# Patient Record
Sex: Female | Born: 1970 | Marital: Married | Smoking: Never smoker
Health system: Southern US, Community
[De-identification: ages and names within clinical notes are randomized; demographics above are authoritative.]

---

## 2002-04-27 ENCOUNTER — Other Ambulatory Visit: Admission: RE | Admit: 2002-04-27 | Discharge: 2002-04-27 | Payer: Self-pay | Admitting: *Deleted

## 2003-08-11 ENCOUNTER — Other Ambulatory Visit: Admission: RE | Admit: 2003-08-11 | Discharge: 2003-08-11 | Payer: Self-pay | Admitting: Obstetrics and Gynecology

## 2003-08-24 ENCOUNTER — Ambulatory Visit (HOSPITAL_COMMUNITY): Admission: RE | Admit: 2003-08-24 | Discharge: 2003-08-24 | Payer: Self-pay | Admitting: Obstetrics and Gynecology

## 2003-08-24 ENCOUNTER — Encounter (INDEPENDENT_AMBULATORY_CARE_PROVIDER_SITE_OTHER): Payer: Self-pay | Admitting: Specialist

## 2003-08-24 ENCOUNTER — Ambulatory Visit (HOSPITAL_BASED_OUTPATIENT_CLINIC_OR_DEPARTMENT_OTHER): Admission: RE | Admit: 2003-08-24 | Discharge: 2003-08-24 | Payer: Self-pay | Admitting: Obstetrics and Gynecology

## 2004-05-18 ENCOUNTER — Ambulatory Visit (HOSPITAL_COMMUNITY): Admission: RE | Admit: 2004-05-18 | Discharge: 2004-05-18 | Payer: Self-pay | Admitting: Oncology

## 2004-06-12 ENCOUNTER — Ambulatory Visit: Payer: Self-pay | Admitting: Oncology

## 2004-06-21 ENCOUNTER — Inpatient Hospital Stay (HOSPITAL_COMMUNITY): Admission: AD | Admit: 2004-06-21 | Discharge: 2004-06-23 | Payer: Self-pay | Admitting: Obstetrics and Gynecology

## 2004-06-21 ENCOUNTER — Ambulatory Visit: Payer: Self-pay | Admitting: Oncology

## 2004-07-31 ENCOUNTER — Ambulatory Visit: Payer: Self-pay | Admitting: Oncology

## 2005-03-27 ENCOUNTER — Ambulatory Visit: Payer: Self-pay | Admitting: Oncology

## 2005-05-23 ENCOUNTER — Ambulatory Visit: Payer: Self-pay | Admitting: Oncology

## 2005-07-23 ENCOUNTER — Ambulatory Visit: Payer: Self-pay | Admitting: Oncology

## 2005-10-22 ENCOUNTER — Ambulatory Visit: Payer: Self-pay | Admitting: Oncology

## 2005-10-22 ENCOUNTER — Inpatient Hospital Stay (HOSPITAL_COMMUNITY): Admission: AD | Admit: 2005-10-22 | Discharge: 2005-10-24 | Payer: Self-pay | Admitting: Obstetrics and Gynecology

## 2005-10-24 ENCOUNTER — Ambulatory Visit: Payer: Self-pay | Admitting: Oncology

## 2007-07-02 ENCOUNTER — Ambulatory Visit: Payer: Self-pay | Admitting: Oncology

## 2007-07-07 LAB — CBC WITH DIFFERENTIAL/PLATELET
BASO%: 0.8 % (ref 0.0–2.0)
Basophils Absolute: 0.1 10*3/uL (ref 0.0–0.1)
EOS%: 0.1 % (ref 0.0–7.0)
Eosinophils Absolute: 0 10*3/uL (ref 0.0–0.5)
HCT: 32.6 % — ABNORMAL LOW (ref 34.8–46.6)
HGB: 11.6 g/dL (ref 11.6–15.9)
LYMPH%: 15.2 % (ref 14.0–48.0)
MCH: 33.4 pg (ref 26.0–34.0)
MCHC: 35.5 g/dL (ref 32.0–36.0)
MCV: 93.9 fL (ref 81.0–101.0)
MONO#: 0.6 10*3/uL (ref 0.1–0.9)
MONO%: 6.8 % (ref 0.0–13.0)
NEUT#: 6.7 10*3/uL — ABNORMAL HIGH (ref 1.5–6.5)
NEUT%: 77.1 % — ABNORMAL HIGH (ref 39.6–76.8)
Platelets: 164 10*3/uL (ref 145–400)
RBC: 3.47 10*6/uL — ABNORMAL LOW (ref 3.70–5.32)
RDW: 13.3 % (ref 11.3–14.5)
WBC: 8.7 10*3/uL (ref 3.9–10.0)
lymph#: 1.3 10*3/uL (ref 0.9–3.3)

## 2007-07-07 LAB — HEPARIN ANTI-XA: Heparin LMW: 0.27 IU/mL

## 2007-08-18 ENCOUNTER — Inpatient Hospital Stay (HOSPITAL_COMMUNITY): Admission: RE | Admit: 2007-08-18 | Discharge: 2007-08-20 | Payer: Self-pay | Admitting: Obstetrics and Gynecology

## 2007-09-03 ENCOUNTER — Ambulatory Visit: Payer: Self-pay | Admitting: Oncology

## 2009-05-08 ENCOUNTER — Ambulatory Visit: Payer: Self-pay | Admitting: Vascular Surgery

## 2009-05-08 ENCOUNTER — Encounter (INDEPENDENT_AMBULATORY_CARE_PROVIDER_SITE_OTHER): Payer: Self-pay | Admitting: Obstetrics and Gynecology

## 2009-05-08 ENCOUNTER — Ambulatory Visit: Admission: RE | Admit: 2009-05-08 | Discharge: 2009-05-08 | Payer: Self-pay | Admitting: Obstetrics and Gynecology

## 2009-06-02 ENCOUNTER — Ambulatory Visit: Payer: Self-pay | Admitting: Oncology

## 2009-06-06 LAB — CBC WITH DIFFERENTIAL/PLATELET
BASO%: 0.3 % (ref 0.0–2.0)
Basophils Absolute: 0 10*3/uL (ref 0.0–0.1)
EOS%: 0.3 % (ref 0.0–7.0)
Eosinophils Absolute: 0 10*3/uL (ref 0.0–0.5)
HCT: 35.3 % (ref 34.8–46.6)
HGB: 12.2 g/dL (ref 11.6–15.9)
LYMPH%: 24.7 % (ref 14.0–49.7)
MCH: 32.1 pg (ref 25.1–34.0)
MCHC: 34.7 g/dL (ref 31.5–36.0)
MCV: 92.5 fL (ref 79.5–101.0)
MONO#: 0.5 10*3/uL (ref 0.1–0.9)
MONO%: 8.4 % (ref 0.0–14.0)
NEUT#: 4 10*3/uL (ref 1.5–6.5)
NEUT%: 66.3 % (ref 38.4–76.8)
Platelets: 202 10*3/uL (ref 145–400)
RBC: 3.81 10*6/uL (ref 3.70–5.45)
RDW: 12.4 % (ref 11.2–14.5)
WBC: 6 10*3/uL (ref 3.9–10.3)
lymph#: 1.5 10*3/uL (ref 0.9–3.3)

## 2009-06-06 LAB — COMPREHENSIVE METABOLIC PANEL
ALT: 10 U/L (ref 0–35)
AST: 9 U/L (ref 0–37)
Albumin: 4.3 g/dL (ref 3.5–5.2)
Alkaline Phosphatase: 29 U/L — ABNORMAL LOW (ref 39–117)
BUN: 12 mg/dL (ref 6–23)
CO2: 22 mEq/L (ref 19–32)
Calcium: 9.5 mg/dL (ref 8.4–10.5)
Chloride: 104 mEq/L (ref 96–112)
Creatinine, Ser: 0.73 mg/dL (ref 0.40–1.20)
Glucose, Bld: 86 mg/dL (ref 70–99)
Potassium: 4.2 mEq/L (ref 3.5–5.3)
Sodium: 137 mEq/L (ref 135–145)
Total Bilirubin: 0.4 mg/dL (ref 0.3–1.2)
Total Protein: 7.1 g/dL (ref 6.0–8.3)

## 2009-06-06 LAB — HEPARIN ANTI-XA: Heparin LMW: 0.22 IU/mL

## 2009-06-06 LAB — LACTATE DEHYDROGENASE: LDH: 93 U/L — ABNORMAL LOW (ref 94–250)

## 2009-06-29 ENCOUNTER — Ambulatory Visit (HOSPITAL_COMMUNITY): Admission: RE | Admit: 2009-06-29 | Discharge: 2009-06-29 | Payer: Self-pay | Admitting: Obstetrics and Gynecology

## 2009-12-29 ENCOUNTER — Ambulatory Visit: Payer: Self-pay | Admitting: Physician Assistant

## 2009-12-29 ENCOUNTER — Inpatient Hospital Stay (HOSPITAL_COMMUNITY): Admission: AD | Admit: 2009-12-29 | Discharge: 2009-12-30 | Payer: Self-pay | Admitting: Obstetrics and Gynecology

## 2010-01-25 ENCOUNTER — Ambulatory Visit: Payer: Self-pay | Admitting: Oncology

## 2010-01-29 LAB — CBC WITH DIFFERENTIAL/PLATELET
BASO%: 0.2 % (ref 0.0–2.0)
Basophils Absolute: 0 10*3/uL (ref 0.0–0.1)
EOS%: 0.4 % (ref 0.0–7.0)
Eosinophils Absolute: 0 10*3/uL (ref 0.0–0.5)
HCT: 29.1 % — ABNORMAL LOW (ref 34.8–46.6)
HGB: 10.3 g/dL — ABNORMAL LOW (ref 11.6–15.9)
LYMPH%: 20.7 % (ref 14.0–49.7)
MCH: 32.9 pg (ref 25.1–34.0)
MCHC: 35.3 g/dL (ref 31.5–36.0)
MCV: 93.2 fL (ref 79.5–101.0)
MONO#: 0.5 10*3/uL (ref 0.1–0.9)
MONO%: 7.2 % (ref 0.0–14.0)
NEUT#: 4.9 10*3/uL (ref 1.5–6.5)
NEUT%: 71.5 % (ref 38.4–76.8)
Platelets: 184 10*3/uL (ref 145–400)
RBC: 3.12 10*6/uL — ABNORMAL LOW (ref 3.70–5.45)
RDW: 14.3 % (ref 11.2–14.5)
WBC: 6.8 10*3/uL (ref 3.9–10.3)
lymph#: 1.4 10*3/uL (ref 0.9–3.3)

## 2010-01-29 LAB — D-DIMER, QUANTITATIVE: D-Dimer, Quant: 0.74 ug/mL-FEU — ABNORMAL HIGH (ref 0.00–0.48)

## 2010-01-29 LAB — FIBRINOGEN: Fibrinogen: 362 mg/dL (ref 204–475)

## 2010-05-25 ENCOUNTER — Ambulatory Visit: Payer: Self-pay | Admitting: Oncology

## 2010-05-29 LAB — COMPREHENSIVE METABOLIC PANEL
ALT: <8 U/L (ref 0–35)
AST: 12 U/L (ref 0–37)
Albumin: 3.2 g/dL — ABNORMAL LOW (ref 3.5–5.2)
Alkaline Phosphatase: 158 U/L — ABNORMAL HIGH (ref 39–117)
BUN: 9 mg/dL (ref 6–23)
CO2: 22 mEq/L (ref 19–32)
Calcium: 8.8 mg/dL (ref 8.4–10.5)
Chloride: 105 mEq/L (ref 96–112)
Creatinine, Ser: 0.55 mg/dL (ref 0.40–1.20)
Glucose, Bld: 78 mg/dL (ref 70–99)
Potassium: 3.8 mEq/L (ref 3.5–5.3)
Sodium: 139 mEq/L (ref 135–145)
Total Bilirubin: 0.3 mg/dL (ref 0.3–1.2)
Total Protein: 5.9 g/dL — ABNORMAL LOW (ref 6.0–8.3)

## 2010-05-29 LAB — CBC WITH DIFFERENTIAL/PLATELET
BASO%: 0.2 % (ref 0.0–2.0)
Basophils Absolute: 0 10*3/uL (ref 0.0–0.1)
EOS%: 0.2 % (ref 0.0–7.0)
Eosinophils Absolute: 0 10*3/uL (ref 0.0–0.5)
HCT: 32.4 % — ABNORMAL LOW (ref 34.8–46.6)
HGB: 11.4 g/dL — ABNORMAL LOW (ref 11.6–15.9)
LYMPH%: 18.7 % (ref 14.0–49.7)
MCH: 33.6 pg (ref 25.1–34.0)
MCHC: 35.1 g/dL (ref 31.5–36.0)
MCV: 95.7 fL (ref 79.5–101.0)
MONO#: 0.6 10*3/uL (ref 0.1–0.9)
MONO%: 7.8 % (ref 0.0–14.0)
NEUT#: 5.3 10*3/uL (ref 1.5–6.5)
NEUT%: 73.1 % (ref 38.4–76.8)
Platelets: 124 10*3/uL — ABNORMAL LOW (ref 145–400)
RBC: 3.39 10*6/uL — ABNORMAL LOW (ref 3.70–5.45)
RDW: 13.1 % (ref 11.2–14.5)
WBC: 7.2 10*3/uL (ref 3.9–10.3)
lymph#: 1.4 10*3/uL (ref 0.9–3.3)

## 2010-05-29 LAB — LACTATE DEHYDROGENASE: LDH: 106 U/L (ref 94–250)

## 2010-06-06 ENCOUNTER — Inpatient Hospital Stay (HOSPITAL_COMMUNITY): Admission: AD | Admit: 2010-06-06 | Discharge: 2010-06-08 | Payer: Self-pay | Admitting: Obstetrics and Gynecology

## 2010-06-19 LAB — CBC WITH DIFFERENTIAL/PLATELET
BASO%: 0.2 % (ref 0.0–2.0)
Basophils Absolute: 0 10*3/uL (ref 0.0–0.1)
EOS%: 0.5 % (ref 0.0–7.0)
Eosinophils Absolute: 0 10*3/uL (ref 0.0–0.5)
HCT: 41.9 % (ref 34.8–46.6)
HGB: 13.8 g/dL (ref 11.6–15.9)
LYMPH%: 26.6 % (ref 14.0–49.7)
MCH: 32.2 pg (ref 25.1–34.0)
MCHC: 33 g/dL (ref 31.5–36.0)
MCV: 97.4 fL (ref 79.5–101.0)
MONO#: 0.7 10*3/uL (ref 0.1–0.9)
MONO%: 8.5 % (ref 0.0–14.0)
NEUT#: 5.1 10*3/uL (ref 1.5–6.5)
NEUT%: 64.2 % (ref 38.4–76.8)
Platelets: 257 10*3/uL (ref 145–400)
RBC: 4.3 10*6/uL (ref 3.70–5.45)
RDW: 12.9 % (ref 11.2–14.5)
WBC: 8 10*3/uL (ref 3.9–10.3)
lymph#: 2.1 10*3/uL (ref 0.9–3.3)

## 2010-06-19 LAB — CHCC SMEAR

## 2010-06-19 LAB — MORPHOLOGY
PLT EST: ADEQUATE
RBC Comments: NORMAL

## 2010-10-24 LAB — RH IMMUNE GLOB WKUP(>/=20WKS)(NOT WOMEN'S HOSP)
Fetal Screen: NEGATIVE
Unit division: 0

## 2010-10-24 LAB — CBC
HCT: 31.3 % — ABNORMAL LOW (ref 36.0–46.0)
HCT: 34.3 % — ABNORMAL LOW (ref 36.0–46.0)
Hemoglobin: 11 g/dL — ABNORMAL LOW (ref 12.0–15.0)
Hemoglobin: 11.8 g/dL — ABNORMAL LOW (ref 12.0–15.0)
MCH: 33.5 pg (ref 26.0–34.0)
MCH: 34.3 pg — ABNORMAL HIGH (ref 26.0–34.0)
MCHC: 34.4 g/dL (ref 30.0–36.0)
MCHC: 35.2 g/dL (ref 30.0–36.0)
MCV: 97.3 fL (ref 78.0–100.0)
MCV: 97.6 fL (ref 78.0–100.0)
Platelets: 111 10*3/uL — ABNORMAL LOW (ref 150–400)
Platelets: 136 10*3/uL — ABNORMAL LOW (ref 150–400)
RBC: 3.21 MIL/uL — ABNORMAL LOW (ref 3.87–5.11)
RBC: 3.52 MIL/uL — ABNORMAL LOW (ref 3.87–5.11)
RDW: 13 % (ref 11.5–15.5)
RDW: 13.1 % (ref 11.5–15.5)
WBC: 11.6 10*3/uL — ABNORMAL HIGH (ref 4.0–10.5)
WBC: 8 10*3/uL (ref 4.0–10.5)

## 2010-10-24 LAB — RPR: RPR Ser Ql: NONREACTIVE

## 2010-10-29 LAB — RAPID STREP SCREEN (MED CTR MEBANE ONLY): Streptococcus, Group A Screen (Direct): POSITIVE — AB

## 2010-11-14 LAB — CBC
HCT: 37.1 % (ref 36.0–46.0)
Hemoglobin: 12.4 g/dL (ref 12.0–15.0)
MCHC: 33.5 g/dL (ref 30.0–36.0)
MCV: 93.7 fL (ref 78.0–100.0)
Platelets: 253 10*3/uL (ref 150–400)
RBC: 3.97 MIL/uL (ref 3.87–5.11)
RDW: 12.2 % (ref 11.5–15.5)
WBC: 6.8 10*3/uL (ref 4.0–10.5)

## 2010-11-14 LAB — PROTIME-INR
INR: 0.91 (ref 0.00–1.49)
Prothrombin Time: 12.2 seconds (ref 11.6–15.2)

## 2010-11-14 LAB — RH IMMUNE GLOBULIN WORKUP (NOT WOMEN'S HOSP)
ABO/RH(D): O NEG
Antibody Screen: NEGATIVE

## 2010-11-14 LAB — APTT: aPTT: 25 seconds (ref 24–37)

## 2010-12-25 NOTE — H&P (Signed)
NAME:  Rosero, Alta                ACCOUNT NO.:  [id number]   MEDICAL RECORD NO.:  [id number]          PATIENT TYPE:  INP   LOCATION:  9162                          FACILITY:  [facility]   PHYSICIAN:  Lenoard Aden, M.D.DATE OF BIRTH:  1971/06/18   DATE OF ADMISSION:  08/18/2007  DATE OF DISCHARGE:                              HISTORY & PHYSICAL   CHIEF COMPLAINT:  Coagulopathy on Lovenox.   HISTORY OF PRESENT ILLNESS:  She is a 40 year old white female, G3, P1  at 38-weeks gestation for induction and timed delivery due to use of  anticoagulation and need to reverse anticoagulation with timed  induction.   MEDICATIONS:  Lovenox and prenatal vitamins.   ALLERGIES:  No known drug allergies.   SOCIAL HISTORY:  She is a nonsmoker, nondrinker, denies domestic or  physical violence.   OBSTETRICAL HISTORY:  Remarkable for SAB with __________ in 2005,  history of spontaneous vaginal delivery in 2005 complicated by [facility],  history of uncomplicated vaginal delivery 2007, history of DVT, history  of prothrombin gene mutation.  Prenatal course otherwise uncomplicated.  History of Rh negativity and received RhoGAM on physical examination.   PHYSICAL EXAMINATION:  VITAL SIGNS:  Stable, afebrile.  HEENT:  Normal.  LUNGS:  Clear.  HEART:  Regular rhythm.  ABDOMEN:  Soft, gravid, nontender.  Estimated fetal weight  7 pounds,  cervix is 4 cm, 80% vertex, -1.  Artificial rupture of membranes clear.  EXTREMITIES:  No cords.  NEUROLOGICAL:  Nonfocal.  SKIN:  Intact.   IMPRESSION:  1. A 38-week OB.  2. Thrombophilia with prothrombin gene mutation on Lovenox for timed      induction delivery.   PLAN:  Proceed with epidural.  Hematology follow up as noted.  Resume  anticoagulation postpartum.  Risks and benefits discussed.     Lenoard Aden, M.D.  Electronically Signed    RJT/MEDQ  D:  08/18/2007  T:  08/18/2007  Job:  [id number]

## 2010-12-28 NOTE — Consult Note (Signed)
NAME:  Powers, Margaret                ACCOUNT NO.:  [id number]   MEDICAL RECORD NO.:  [id number]           PATIENT TYPE:   LOCATION:                                 FACILITY:   PHYSICIAN:  Genene Churn. Cyndie Chime, M.D.  DATE OF BIRTH:   DATE OF CONSULTATION:  06/11/2004  DATE OF DISCHARGE:                                   CONSULTATION   HISTORY:  This is a hematology consultation to evaluate this lady for  perioperative anticoagulation in view of underlying congenital coagulopathy.   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  She was on  birth control pills with a transdermal estrogen patch at the time.  She had  no prior, personal, or family history of any clotting problems.  Special  hematology studies were done and she was found to be a heterozygote for the  prothrombin gene mutation.  Factor V Leiden mutation was not detected.  Anticardiolipin antibodies/antiphospholipid antibodies were not detected.  She was anticoagulated with Coumadin initially.   She became pregnant in late November 2004.  The plan was to begin her on  prophylactic Lovenox starting in the third trimester.  Unfortunately, she  was only on the Lovenox for a few weeks when she had a miscarriage in  January 2005.  She got pregnant again in February and notified us at about 5  weeks.  She was started promptly on low-dose Lovenox 40 mg subcutaneous  daily.  She has been on this dose throughout the entire pregnancy until very  recently.  On May 18, 2004 she was evaluated for two isolated episodes 24  hours apart of acute onset of dyspnea and palpitations.  CT scan of the  chest with pulmonary embolus protocol was unremarkable for a pulmonary  embolus.  However, I elected to increase her to a therapeutic dose of the  Lovenox (90 mg).  She is now admitted at [redacted] weeks gestation for a planned  induction of labor.  Current  Lovenox dose is 90 mg daily.  CBC done in our  office on June 05, 2004 showed a hemoglobin of 12.8 and platelet count of  190,000.  Factor Xa level on Lovenox was 0.69, with therapeutic range 0.6-  1.0.   IMPRESSION:  A woman with prothrombin gene heterozygote status, now at term  of her second pregnancy.   RECOMMENDATIONS:  The patient has been instructed to stop her Lovenox 24  hours prior to planned induction.  We will check a STAT factor Xa (heparin)  level.  If the level is, in fact, undetectable then proceed with spinal  anesthesia and induction of labor as planned.  Since there is no good agent  to reverse Lovenox, if levels are still in a therapeutic range we would have  to wait on the spinal anesthetic to ensure that we do not have any epidural  bleeding.   I will resume Lovenox 8 hours after removal of epidural catheter at 1 mg/kg  q.12h., and then begin her on Coumadin 10  mg daily which was her therapeutic  dose in the past.   We will continue to monitor protimes and heparin levels in my office after  hospital discharge.   Thank you for this consultation.                                                       Genene Churn. Cyndie Chime, M.D.  Electronically Signed    JMG/MEDQ  D:  06/11/2004  T:  06/11/2004  Job:  [id number]   cc:   Lenoard Aden, M.D.  [address]  [place]  [place] [address]  Fax: [phone]

## 2010-12-28 NOTE — Op Note (Signed)
NAME:  Margaret Powers, Margaret Powers                            ACCOUNT NO.:  [id number]   MEDICAL RECORD NO.:  [id number]                   PATIENT TYPE:  AMB   LOCATION:  [facility]                                 FACILITY:  [facility]   PHYSICIAN:  Lenoard Aden, M.D.             DATE OF BIRTH:  1971-08-06   DATE OF PROCEDURE:  08/24/2003  DATE OF DISCHARGE:                                 OPERATIVE REPORT   PREOPERATIVE DIAGNOSIS:  Missed abortion at nine weeks.   POSTOPERATIVE DIAGNOSIS:  Missed abortion at nine weeks.   PROCEDURE:  Suction D&E.   SURGEON:  Lenoard Aden, M.D.   ANESTHESIA:  MAC paracervical.   ESTIMATED BLOOD LOSS:  Less than 50 cc.   COMPLICATIONS:  None.   DRAINS:  None.   COUNTS:  Correct.   DISPOSITION:  The patient to recovery room in good condition.  Products of  conception to pathology.   BRIEF OPERATIVE NOTE:  After being apprised of the risks of anesthesia,  infection, bleeding, uterine perforation with need for repair, the patient  is brought to the operating room.  She was administered IV sedation without  difficulty, prepped and draped in the usual sterile fashion.  A catheter to  the bladder and was emptied after achieving adequate anesthesia.  Dilute  Xylocaine solution placed in the standard paracervical block (22 cc).   Examination under anesthesia reveals a small anteflexed, 6-8 week size  uterus.  This was easily dilated up to a #21 Pratt dilator.  A  7 mm suction curet placed.  Products of conception noted and obtained during  suction.  Repeat suction and blunt curettage in a four-quadrant method  reveals the cavity to be empty.  Good hemostasis was noted.  All instruments  are removed.  The patient tolerated the procedure well and was taken to  recovery room in good condition.                                               Lenoard Aden, M.D.    RJT/MEDQ  D:  08/24/2003  T:  08/24/2003  Job:  [id number]

## 2011-05-01 LAB — CBC
HCT: 32.4 — ABNORMAL LOW
HCT: 33 — ABNORMAL LOW
HCT: 33.4 — ABNORMAL LOW
Hemoglobin: 11.6 — ABNORMAL LOW
Hemoglobin: 11.6 — ABNORMAL LOW
Hemoglobin: 11.6 — ABNORMAL LOW
MCHC: 34.8
MCHC: 35
MCHC: 35.8
MCV: 95
MCV: 95
Platelets: 144 — ABNORMAL LOW
Platelets: 146 — ABNORMAL LOW
RBC: 3.41 — ABNORMAL LOW
RBC: 3.43 — ABNORMAL LOW
RBC: 3.51 — ABNORMAL LOW
RDW: 12.8
RDW: 13.5
RDW: 13.7
WBC: 11.3 — ABNORMAL HIGH
WBC: 7.7
WBC: 9.5

## 2011-05-01 LAB — RPR: RPR Ser Ql: NONREACTIVE

## 2012-09-22 ENCOUNTER — Other Ambulatory Visit: Payer: Self-pay | Admitting: Obstetrics and Gynecology

## 2012-09-22 DIAGNOSIS — Z1231 Encounter for screening mammogram for malignant neoplasm of breast: Secondary | ICD-10-CM

## 2012-10-28 ENCOUNTER — Ambulatory Visit
Admission: RE | Admit: 2012-10-28 | Discharge: 2012-10-28 | Disposition: A | Discharge status: Home / Self Care | Payer: BC Managed Care – PPO | Source: Ambulatory Visit | Attending: Obstetrics and Gynecology | Admitting: Obstetrics and Gynecology

## 2012-10-28 DIAGNOSIS — Z1231 Encounter for screening mammogram for malignant neoplasm of breast: Secondary | ICD-10-CM

## 2013-11-11 ENCOUNTER — Other Ambulatory Visit: Payer: Self-pay

## 2013-11-11 DIAGNOSIS — Z1231 Encounter for screening mammogram for malignant neoplasm of breast: Secondary | ICD-10-CM

## 2013-12-01 ENCOUNTER — Ambulatory Visit: Payer: BC Managed Care – PPO

## 2013-12-13 ENCOUNTER — Ambulatory Visit
Admission: RE | Admit: 2013-12-13 | Discharge: 2013-12-13 | Disposition: A | Discharge status: Home / Self Care | Payer: BC Managed Care – PPO | Source: Ambulatory Visit

## 2013-12-13 DIAGNOSIS — Z1231 Encounter for screening mammogram for malignant neoplasm of breast: Secondary | ICD-10-CM

## 2014-10-12 ENCOUNTER — Other Ambulatory Visit: Payer: Self-pay

## 2014-10-12 DIAGNOSIS — Z1231 Encounter for screening mammogram for malignant neoplasm of breast: Secondary | ICD-10-CM

## 2014-11-16 ENCOUNTER — Telehealth: Payer: Self-pay | Admitting: *Deleted

## 2014-11-16 NOTE — Telephone Encounter (Signed)
Message per pt - states [place] will be [place] to [place] this summer and the flight one direction takes 24hrs. She has a hx of DVT.  Wants to know if she will need to be placed on a blood thinner ;  Does she needs to schedule an appt w/you?  States she has not been having any problems. Telephone # [phone]. Thanks

## 2014-11-21 NOTE — Telephone Encounter (Signed)
I would advise her to start aspirin 325 mg the day before travel then 81 mg daily and to wear elastic stockings.

## 2014-11-22 NOTE — Telephone Encounter (Signed)
Pt called / instructed to "start ASA 325mg  the day before travel then 81 mg daily and to wear elastic stockings". Per Dr Cyndie ChimeGranfortuna. Pt voiced understanding; repeated instructions.  Pt asked if it's ok take ASA instead of Coumadin ; talked to Dr Cyndie ChimeGranfortuna who stated it's ok; pt informed.

## 2015-02-03 ENCOUNTER — Telehealth: Payer: Self-pay | Admitting: *Deleted

## 2015-02-03 ENCOUNTER — Other Ambulatory Visit: Payer: Self-pay | Admitting: Internal Medicine

## 2015-02-03 DIAGNOSIS — R5081 Fever presenting with conditions classified elsewhere: Secondary | ICD-10-CM

## 2015-02-03 LAB — COMPREHENSIVE METABOLIC PANEL
ALT: <8 U/L (ref 0–35)
AST: 13 U/L (ref 0–37)
Albumin: 3.9 g/dL (ref 3.5–5.2)
Alkaline Phosphatase: 45 U/L (ref 39–117)
BILIRUBIN TOTAL: 0.4 mg/dL (ref 0.2–1.2)
BUN: 11 mg/dL (ref 6–23)
CO2: 24 mEq/L (ref 19–32)
Calcium: 9.1 mg/dL (ref 8.4–10.5)
Chloride: 105 mEq/L (ref 96–112)
Creat: 0.84 mg/dL (ref 0.50–1.10)
Glucose, Bld: 93 mg/dL (ref 70–99)
Potassium: 3.7 mEq/L (ref 3.5–5.3)
Sodium: 141 mEq/L (ref 135–145)
Total Protein: 7.3 g/dL (ref 6.0–8.3)

## 2015-02-03 LAB — CBC WITH DIFFERENTIAL/PLATELET
Basophils Absolute: 0 10*3/uL (ref 0.0–0.1)
Basophils Relative: 0 % (ref 0–1)
Eosinophils Absolute: 0 10*3/uL (ref 0.0–0.7)
Eosinophils Relative: 0 % (ref 0–5)
HCT: 40.8 % (ref 36.0–46.0)
Hemoglobin: 13.7 g/dL (ref 12.0–15.0)
Lymphocytes Relative: 18 % (ref 12–46)
Lymphs Abs: 0.8 10*3/uL (ref 0.7–4.0)
MCH: 30.8 pg (ref 26.0–34.0)
MCHC: 33.6 g/dL (ref 30.0–36.0)
MCV: 91.7 fL (ref 78.0–100.0)
MONO ABS: 0.3 10*3/uL (ref 0.1–1.0)
MPV: 10.8 fL (ref 8.6–12.4)
Monocytes Relative: 6 % (ref 3–12)
Neutro Abs: 3.5 10*3/uL (ref 1.7–7.7)
Neutrophils Relative %: 76 % (ref 43–77)
PLATELETS: 194 10*3/uL (ref 150–400)
RBC: 4.45 MIL/uL (ref 3.87–5.11)
RDW: 13 % (ref 11.5–15.5)
WBC: 4.6 10*3/uL (ref 4.0–10.5)

## 2015-02-03 LAB — HEPATITIS A ANTIBODY, IGM: HEP A IGM: NONREACTIVE

## 2015-02-03 NOTE — Telephone Encounter (Signed)
-----   Message from Gardiner Barefoot, MD sent at 02/03/2015  7:43 AM EDT ----- She is a post traveler who is sick.  She is going to come in at 9am and get labs - blood cultures x 2, cbc with diff, cmp, malaria smear, dengue IgM, hepatitis A IgM, stool culture.  I will probably stop by and see her too (she is an employee and I know her).   Thanks

## 2015-02-03 NOTE — Telephone Encounter (Signed)
Order written and patient will take to [facility] as [facility] lab closed today. Patient and MD aware Wendall Mola

## 2015-02-05 LAB — MALARIA SMEAR
RESULT - MALAR: NEGATIVE
Result - MALAR: NONE SEEN

## 2015-02-05 LAB — DENGUE FEVER ABS, IGG, IGM
DENGUE FEVER ANTIBODIES (IGG,IGM): 0.8
Dengue Fever Ab (IgM): 0.19

## 2015-02-09 LAB — CULTURE, BLOOD (SINGLE)
CULTURE: NO GROWTH
ORGANISM ID, BACTERIA: NO GROWTH
Organism ID, Bacteria: NO GROWTH

## 2015-06-08 ENCOUNTER — Ambulatory Visit
Admission: RE | Admit: 2015-06-08 | Discharge: 2015-06-08 | Disposition: A | Discharge status: Home / Self Care | Payer: BLUE CROSS/BLUE SHIELD | Source: Ambulatory Visit

## 2015-06-08 DIAGNOSIS — Z1231 Encounter for screening mammogram for malignant neoplasm of breast: Secondary | ICD-10-CM

## 2015-06-13 ENCOUNTER — Other Ambulatory Visit: Payer: Self-pay | Admitting: Obstetrics and Gynecology

## 2015-06-13 DIAGNOSIS — R928 Other abnormal and inconclusive findings on diagnostic imaging of breast: Secondary | ICD-10-CM

## 2015-06-19 ENCOUNTER — Ambulatory Visit
Admission: RE | Admit: 2015-06-19 | Discharge: 2015-06-19 | Disposition: A | Discharge status: Home / Self Care | Payer: BLUE CROSS/BLUE SHIELD | Source: Ambulatory Visit | Attending: Obstetrics and Gynecology | Admitting: Obstetrics and Gynecology

## 2015-06-19 DIAGNOSIS — R928 Other abnormal and inconclusive findings on diagnostic imaging of breast: Secondary | ICD-10-CM

## 2015-09-12 ENCOUNTER — Ambulatory Visit (INDEPENDENT_AMBULATORY_CARE_PROVIDER_SITE_OTHER): Payer: BLUE CROSS/BLUE SHIELD | Admitting: Internal Medicine

## 2015-09-12 DIAGNOSIS — Z9189 Other specified personal risk factors, not elsewhere classified: Secondary | ICD-10-CM

## 2015-09-12 MED ORDER — CIPROFLOXACIN HCL 500 MG PO TABS: 500.0000 mg | ORAL_TABLET | Freq: Two times a day (BID) | ORAL | Status: DC

## 2015-09-12 MED ORDER — ATOVAQUONE-PROGUANIL HCL 250-100 MG PO TABS: 1.0000 | ORAL_TABLET | Freq: Every day | ORAL | Status: DC

## 2015-09-12 NOTE — Progress Notes (Signed)
  RFV: pre travel counseling [place] Subjective:    Patient ID: Margaret Powers, female    DOB: 1971-06-07, 45 y.o.   MRN: [id number]  HPI Going on medical mission to [place], and [place], [place] from feb 16 thru 28th  Went to [place] last year, complicated by having 12 days of diarrhea upon return  Works in healthcare, uptodate on vaccines   Review of Systems     Objective:   Physical Exam        Assessment & Plan:  Recommend to get hep A #2, which she is contemplating getting  Malaria prophylaxis Will give rx for malarone,   traverlers' diarrhea = gave rx for cipro  Also gave recs to get emergency evacuation insurance

## 2015-09-29 ENCOUNTER — Encounter: Payer: BLUE CROSS/BLUE SHIELD | Admitting: Internal Medicine

## 2016-06-25 ENCOUNTER — Other Ambulatory Visit: Payer: Self-pay | Admitting: Obstetrics and Gynecology

## 2016-06-25 DIAGNOSIS — Z1231 Encounter for screening mammogram for malignant neoplasm of breast: Secondary | ICD-10-CM

## 2016-07-30 ENCOUNTER — Ambulatory Visit
Admission: RE | Admit: 2016-07-30 | Discharge: 2016-07-30 | Disposition: A | Discharge status: Home / Self Care | Payer: BLUE CROSS/BLUE SHIELD | Source: Ambulatory Visit | Attending: Obstetrics and Gynecology | Admitting: Obstetrics and Gynecology

## 2016-07-30 DIAGNOSIS — Z1231 Encounter for screening mammogram for malignant neoplasm of breast: Secondary | ICD-10-CM

## 2017-08-19 ENCOUNTER — Other Ambulatory Visit: Payer: Self-pay | Admitting: Obstetrics and Gynecology

## 2017-08-19 DIAGNOSIS — Z1231 Encounter for screening mammogram for malignant neoplasm of breast: Secondary | ICD-10-CM

## 2017-09-09 ENCOUNTER — Ambulatory Visit
Admission: RE | Admit: 2017-09-09 | Discharge: 2017-09-09 | Disposition: A | Discharge status: Home / Self Care | Payer: BLUE CROSS/BLUE SHIELD | Source: Ambulatory Visit | Attending: Obstetrics and Gynecology | Admitting: Obstetrics and Gynecology

## 2017-09-09 DIAGNOSIS — Z1231 Encounter for screening mammogram for malignant neoplasm of breast: Secondary | ICD-10-CM | POA: Diagnosis not present

## 2017-09-10 ENCOUNTER — Other Ambulatory Visit: Payer: Self-pay | Admitting: Obstetrics and Gynecology

## 2017-09-10 DIAGNOSIS — R928 Other abnormal and inconclusive findings on diagnostic imaging of breast: Secondary | ICD-10-CM

## 2017-09-12 ENCOUNTER — Ambulatory Visit
Admission: RE | Admit: 2017-09-12 | Discharge: 2017-09-12 | Disposition: A | Discharge status: Home / Self Care | Payer: BLUE CROSS/BLUE SHIELD | Source: Ambulatory Visit | Attending: Obstetrics and Gynecology | Admitting: Obstetrics and Gynecology

## 2017-09-12 ENCOUNTER — Ambulatory Visit: Admission: RE | Admit: 2017-09-12 | Payer: BLUE CROSS/BLUE SHIELD | Source: Ambulatory Visit

## 2017-09-12 DIAGNOSIS — R928 Other abnormal and inconclusive findings on diagnostic imaging of breast: Secondary | ICD-10-CM

## 2017-09-12 DIAGNOSIS — R922 Inconclusive mammogram: Secondary | ICD-10-CM | POA: Diagnosis not present

## 2017-11-04 DIAGNOSIS — L821 Other seborrheic keratosis: Secondary | ICD-10-CM | POA: Diagnosis not present

## 2017-11-04 DIAGNOSIS — L720 Epidermal cyst: Secondary | ICD-10-CM | POA: Diagnosis not present

## 2017-11-04 DIAGNOSIS — D2361 Other benign neoplasm of skin of right upper limb, including shoulder: Secondary | ICD-10-CM | POA: Diagnosis not present

## 2017-11-04 DIAGNOSIS — L570 Actinic keratosis: Secondary | ICD-10-CM | POA: Diagnosis not present

## 2017-11-04 DIAGNOSIS — D2262 Melanocytic nevi of left upper limb, including shoulder: Secondary | ICD-10-CM | POA: Diagnosis not present

## 2018-03-09 DIAGNOSIS — F419 Anxiety disorder, unspecified: Secondary | ICD-10-CM | POA: Diagnosis not present

## 2018-04-30 DIAGNOSIS — F419 Anxiety disorder, unspecified: Secondary | ICD-10-CM | POA: Diagnosis not present

## 2018-09-16 ENCOUNTER — Other Ambulatory Visit: Payer: Self-pay | Admitting: Obstetrics and Gynecology

## 2018-09-16 DIAGNOSIS — Z1231 Encounter for screening mammogram for malignant neoplasm of breast: Secondary | ICD-10-CM

## 2018-10-09 DIAGNOSIS — Z7184 Encounter for health counseling related to travel: Secondary | ICD-10-CM | POA: Diagnosis not present

## 2018-10-09 DIAGNOSIS — Z6822 Body mass index (BMI) 22.0-22.9, adult: Secondary | ICD-10-CM | POA: Diagnosis not present

## 2018-10-09 DIAGNOSIS — Z86718 Personal history of other venous thrombosis and embolism: Secondary | ICD-10-CM | POA: Diagnosis not present

## 2018-10-09 DIAGNOSIS — Z1331 Encounter for screening for depression: Secondary | ICD-10-CM | POA: Diagnosis not present

## 2018-10-12 ENCOUNTER — Ambulatory Visit
Admission: RE | Admit: 2018-10-12 | Discharge: 2018-10-12 | Disposition: A | Discharge status: Home / Self Care | Payer: BLUE CROSS/BLUE SHIELD | Source: Ambulatory Visit | Attending: Obstetrics and Gynecology | Admitting: Obstetrics and Gynecology

## 2018-10-12 DIAGNOSIS — Z1231 Encounter for screening mammogram for malignant neoplasm of breast: Secondary | ICD-10-CM | POA: Diagnosis not present

## 2018-10-13 ENCOUNTER — Other Ambulatory Visit: Payer: Self-pay | Admitting: Obstetrics and Gynecology

## 2018-10-13 DIAGNOSIS — R928 Other abnormal and inconclusive findings on diagnostic imaging of breast: Secondary | ICD-10-CM

## 2018-10-15 ENCOUNTER — Ambulatory Visit
Admission: RE | Admit: 2018-10-15 | Discharge: 2018-10-15 | Disposition: A | Discharge status: Home / Self Care | Payer: BLUE CROSS/BLUE SHIELD | Source: Ambulatory Visit | Attending: Obstetrics and Gynecology | Admitting: Obstetrics and Gynecology

## 2018-10-15 DIAGNOSIS — R928 Other abnormal and inconclusive findings on diagnostic imaging of breast: Secondary | ICD-10-CM

## 2018-10-15 DIAGNOSIS — R922 Inconclusive mammogram: Secondary | ICD-10-CM | POA: Diagnosis not present

## 2019-01-06 ENCOUNTER — Other Ambulatory Visit: Payer: Self-pay

## 2019-01-06 ENCOUNTER — Ambulatory Visit (INDEPENDENT_AMBULATORY_CARE_PROVIDER_SITE_OTHER): Payer: Self-pay | Admitting: Plastic Surgery

## 2019-01-06 ENCOUNTER — Encounter: Payer: Self-pay | Admitting: Plastic Surgery

## 2019-01-06 VITALS — BP 127/76 | HR 77 | Temp 98.2°F | Ht 66.0 in | Wt 147.6 lb

## 2019-01-06 DIAGNOSIS — Z719 Counseling, unspecified: Secondary | ICD-10-CM | POA: Insufficient documentation

## 2019-01-06 NOTE — Progress Notes (Signed)
Botulinum Toxin Injection Procedure Note  Procedure: Cosmetic botulinum toxin  Pre-operative Diagnosis: Dynamic rhytides   Post-operative Diagnosis: Same  Complications:  None  Brief history: The patient desires botulinum toxin injection of her forehead. I discussed with the patient this proposed procedure of botulinum toxin injections, which is customized depending on the particular needs of the patient. It is performed on facial rhytids as a temporary correction. The alternatives were discussed with the patient. The risks were addressed including bleeding, scarring, infection, damage to deeper structures, asymmetry, and chronic pain, which may occur infrequently after a procedure. The individual's choice to undergo a surgical procedure is based on the comparison of risks to potential benefits. Other risks include unsatisfactory results, brow ptosis, eyelid ptosis, allergic reaction, temporary paralysis, which should go away with time, bruising, blurring disturbances and delayed healing. Botulinum toxin injections do not arrest the aging process or produce permanent tightening of the eyelid.  Operative intervention maybe necessary to maintain the results of a blepharoplasty or botulinum toxin. The patient understands and wishes to proceed. An informed consent was signed and informational brochures given to her prior to the procedure.  Procedure: The area was prepped with alcohol and dried with a clean gauze. Using a clean technique, the botulinum toxin was diluted with 1.25 cc of preservative-free normal saline which was slowly injected with an 18 gauge needle in a tuberculin syringes.  A 32 gauge needles were then used to inject the botulinum toxin. This mixture allow for an aliquot of 5 units per 0.1 cc in each injection site.    Subsequently the mixture was injected in the glabellar and forehead area with preservation of the temporal branch to the lateral eyebrow as well as into each lateral  canthal area beginning from the lateral orbital rim medial to the zygomaticus major in 3 separate areas. A total of 30 Units of botulinum toxin was used. The forehead and glabellar area was injected with care to inject intramuscular only while holding pressure on the supratrochlear vessels in each area during each injection on either side of the medial corrugators. The injection proceeded vertically superiorly to the medial 2/3 of the frontalis muscle and superior 2/3 of the lateral frontalis, again with preservation of the frontal branch.  No complications were noted. Light pressure was held for 5 minutes. She was instructed explicitly in post-operative care.  Botox LOT:  [id number] C2 EXP:  5/22

## 2019-07-26 DIAGNOSIS — L0292 Furuncle, unspecified: Secondary | ICD-10-CM | POA: Diagnosis not present

## 2019-11-29 DIAGNOSIS — Z01419 Encounter for gynecological examination (general) (routine) without abnormal findings: Secondary | ICD-10-CM | POA: Diagnosis not present

## 2019-11-29 DIAGNOSIS — Z1151 Encounter for screening for human papillomavirus (HPV): Secondary | ICD-10-CM | POA: Diagnosis not present

## 2019-11-29 DIAGNOSIS — Z6824 Body mass index (BMI) 24.0-24.9, adult: Secondary | ICD-10-CM | POA: Diagnosis not present

## 2019-12-13 ENCOUNTER — Other Ambulatory Visit: Payer: BLUE CROSS/BLUE SHIELD

## 2019-12-20 ENCOUNTER — Ambulatory Visit: Payer: BC Managed Care – PPO | Attending: Internal Medicine

## 2019-12-20 DIAGNOSIS — Z20828 Contact with and (suspected) exposure to other viral communicable diseases: Secondary | ICD-10-CM | POA: Diagnosis not present

## 2020-04-17 ENCOUNTER — Encounter: Payer: Self-pay | Admitting: Plastic Surgery

## 2020-04-17 NOTE — Progress Notes (Signed)
Botulinum Toxin Procedure Note ° °Procedure: Cosmetic botulinum toxin ° °Pre-operative Diagnosis: Dynamic rhytides  ° °Post-operative Diagnosis: Same ° °Complications:  None ° °Brief history: The patient desires botulinum toxin injection of her forehead. I discussed with the patient this proposed procedure of botulinum toxin injections, which is customized depending on the particular needs of the patient. It is performed on facial rhytids as a temporary correction. The alternatives were discussed with the patient. The risks were addressed including bleeding, scarring, infection, damage to deeper structures, asymmetry, and chronic pain, which may occur infrequently after a procedure. The individual's choice to undergo a surgical procedure is based on the comparison of risks to potential benefits. Other risks include unsatisfactory results, brow ptosis, eyelid ptosis, allergic reaction, temporary paralysis, which should go away with time, bruising, blurring disturbances and delayed healing. Botulinum toxin injections do not arrest the aging process or produce permanent tightening of the eyelid.  Operative intervention maybe necessary to maintain the results of a blepharoplasty or botulinum toxin. The patient understands and wishes to proceed. ° °Procedure: The area was prepped with alcohol and dried with a clean gauze. Using a clean technique, the botulinum toxin was diluted with 1.25 cc of preservative-free normal saline which was slowly injected with an 18 gauge needle in a tuberculin syringes.  A 32 gauge needles were then used to inject the botulinum toxin. This mixture allow for an aliquot of 5 units per 0.1 cc in each injection site.   ° °Subsequently the mixture was injected in the glabellar and forehead area with preservation of the temporal branch to the lateral eyebrow as well as into each lateral canthal area beginning from the lateral orbital rim medial to the zygomaticus major in 3 separate areas. A total  of 30 Units of botulinum toxin was used. The forehead and glabellar area was injected with care to inject intramuscular only while holding pressure on the supratrochlear vessels in each area during each injection on either side of the medial corrugators. The injection proceeded vertically superiorly to the medial 2/3 of the frontalis muscle and superior 2/3 of the lateral frontalis, again with preservation of the frontal branch.  No complications were noted. Light pressure was held for 5 minutes. She was instructed explicitly in post-operative care. ° °Botox °LOT:  [id number] C4 °EXP:  11/23 °

## 2020-04-18 ENCOUNTER — Other Ambulatory Visit: Payer: Self-pay

## 2020-04-18 ENCOUNTER — Ambulatory Visit (INDEPENDENT_AMBULATORY_CARE_PROVIDER_SITE_OTHER): Payer: Self-pay | Admitting: Plastic Surgery

## 2020-04-18 ENCOUNTER — Encounter: Payer: Self-pay | Admitting: Plastic Surgery

## 2020-04-18 VITALS — BP 117/73 | HR 61 | Temp 98.5°F

## 2020-04-18 DIAGNOSIS — Z719 Counseling, unspecified: Secondary | ICD-10-CM

## 2020-07-24 DIAGNOSIS — Z0000 Encounter for general adult medical examination without abnormal findings: Secondary | ICD-10-CM | POA: Diagnosis not present

## 2020-07-30 DIAGNOSIS — Z03818 Encounter for observation for suspected exposure to other biological agents ruled out: Secondary | ICD-10-CM | POA: Diagnosis not present

## 2020-07-31 DIAGNOSIS — Z1331 Encounter for screening for depression: Secondary | ICD-10-CM | POA: Diagnosis not present

## 2020-07-31 DIAGNOSIS — Z0000 Encounter for general adult medical examination without abnormal findings: Secondary | ICD-10-CM | POA: Diagnosis not present

## 2020-07-31 DIAGNOSIS — Z1339 Encounter for screening examination for other mental health and behavioral disorders: Secondary | ICD-10-CM | POA: Diagnosis not present

## 2020-10-13 ENCOUNTER — Other Ambulatory Visit: Payer: Self-pay | Admitting: Obstetrics and Gynecology

## 2020-10-13 DIAGNOSIS — Z1231 Encounter for screening mammogram for malignant neoplasm of breast: Secondary | ICD-10-CM

## 2020-11-14 ENCOUNTER — Encounter: Payer: Self-pay | Admitting: Plastic Surgery

## 2020-11-14 ENCOUNTER — Ambulatory Visit (INDEPENDENT_AMBULATORY_CARE_PROVIDER_SITE_OTHER): Payer: Self-pay | Admitting: Plastic Surgery

## 2020-11-14 ENCOUNTER — Other Ambulatory Visit: Payer: Self-pay

## 2020-11-14 VITALS — BP 118/77 | HR 69

## 2020-11-14 DIAGNOSIS — Z719 Counseling, unspecified: Secondary | ICD-10-CM

## 2020-11-14 NOTE — Progress Notes (Signed)
Botulinum Toxin Procedure Note  Procedure: Cosmetic botulinum toxin  Pre-operative Diagnosis: Dynamic rhytides  Post-operative Diagnosis: Same  Complications:  None  Brief history: The patient desires botulinum toxin injection of her forehead. I discussed with the patient this proposed procedure of botulinum toxin injections, which is customized depending on the particular needs of the patient. It is performed on facial rhytids as a temporary correction. The alternatives were discussed with the patient. The risks were addressed including bleeding, scarring, infection, damage to deeper structures, asymmetry, and chronic pain, which may occur infrequently after a procedure. The individual's choice to undergo a surgical procedure is based on the comparison of risks to potential benefits. Other risks include unsatisfactory results, brow ptosis, eyelid ptosis, allergic reaction, temporary paralysis, which should go away with time, bruising, blurring disturbances and delayed healing. Botulinum toxin injections do not arrest the aging process or produce permanent tightening of the eyelid.  Operative intervention maybe necessary to maintain the results of a blepharoplasty or botulinum toxin. The patient understands and wishes to proceed.  Procedure: The area was prepped with alcohol and dried with a clean gauze. Using a clean technique, the botulinum toxin was diluted with 1.25 cc of preservative-free normal saline which was slowly injected with an 18 gauge needle in a tuberculin syringes.  A 32 gauge needles were then used to inject the botulinum toxin. This mixture allow for an aliquot of 5 units per 0.1 cc in each injection site.    Subsequently the mixture was injected in the glabellar and forehead area with preservation of the temporal branch to the lateral eyebrow as well as into each lateral canthal area beginning from the lateral orbital rim medial to the zygomaticus major in 3 separate areas. A total  of 30 Units of botulinum toxin was used. The forehead and glabellar area was injected with care to inject intramuscular only while holding pressure on the supratrochlear vessels in each area during each injection on either side of the medial corrugators. The injection proceeded vertically superiorly to the medial 2/3 of the frontalis muscle and superior 2/3 of the lateral frontalis, again with preservation of the frontal branch.  No complications were noted. Light pressure was held for 5 minutes. She was instructed explicitly in post-operative care.  Botox LOT:  [id number] C4 EXP:  6/24 

## 2020-12-06 ENCOUNTER — Ambulatory Visit
Admission: RE | Admit: 2020-12-06 | Discharge: 2020-12-06 | Disposition: A | Discharge status: Home / Self Care | Payer: BC Managed Care – PPO | Source: Ambulatory Visit | Attending: Obstetrics and Gynecology | Admitting: Obstetrics and Gynecology

## 2020-12-06 ENCOUNTER — Other Ambulatory Visit: Payer: Self-pay

## 2020-12-06 DIAGNOSIS — Z1231 Encounter for screening mammogram for malignant neoplasm of breast: Secondary | ICD-10-CM | POA: Diagnosis not present

## 2021-05-25 ENCOUNTER — Ambulatory Visit (INDEPENDENT_AMBULATORY_CARE_PROVIDER_SITE_OTHER): Payer: Self-pay | Admitting: Plastic Surgery

## 2021-05-25 ENCOUNTER — Other Ambulatory Visit: Payer: Self-pay

## 2021-05-25 DIAGNOSIS — Z719 Counseling, unspecified: Secondary | ICD-10-CM

## 2021-05-25 NOTE — Progress Notes (Signed)

## 2021-09-06 DIAGNOSIS — R7989 Other specified abnormal findings of blood chemistry: Secondary | ICD-10-CM | POA: Diagnosis not present

## 2021-09-06 DIAGNOSIS — Z86718 Personal history of other venous thrombosis and embolism: Secondary | ICD-10-CM | POA: Diagnosis not present

## 2021-09-12 DIAGNOSIS — R82998 Other abnormal findings in urine: Secondary | ICD-10-CM | POA: Diagnosis not present

## 2021-09-12 DIAGNOSIS — Z0000 Encounter for general adult medical examination without abnormal findings: Secondary | ICD-10-CM | POA: Diagnosis not present

## 2021-09-12 DIAGNOSIS — Z1331 Encounter for screening for depression: Secondary | ICD-10-CM | POA: Diagnosis not present

## 2021-09-12 DIAGNOSIS — Z1339 Encounter for screening examination for other mental health and behavioral disorders: Secondary | ICD-10-CM | POA: Diagnosis not present

## 2021-09-14 ENCOUNTER — Encounter: Payer: Self-pay | Admitting: Plastic Surgery

## 2021-09-14 ENCOUNTER — Ambulatory Visit (INDEPENDENT_AMBULATORY_CARE_PROVIDER_SITE_OTHER): Payer: Self-pay | Admitting: Plastic Surgery

## 2021-09-14 ENCOUNTER — Other Ambulatory Visit: Payer: Self-pay

## 2021-09-14 DIAGNOSIS — Z719 Counseling, unspecified: Secondary | ICD-10-CM

## 2021-09-14 NOTE — Progress Notes (Signed)

## 2021-09-28 ENCOUNTER — Encounter: Payer: BC Managed Care – PPO | Admitting: Plastic Surgery

## 2021-12-24 ENCOUNTER — Other Ambulatory Visit: Payer: Self-pay | Admitting: Obstetrics and Gynecology

## 2021-12-24 DIAGNOSIS — Z1231 Encounter for screening mammogram for malignant neoplasm of breast: Secondary | ICD-10-CM

## 2022-01-03 ENCOUNTER — Ambulatory Visit
Admission: RE | Admit: 2022-01-03 | Discharge: 2022-01-03 | Disposition: A | Discharge status: Home / Self Care | Payer: BC Managed Care – PPO | Source: Ambulatory Visit | Attending: Obstetrics and Gynecology | Admitting: Obstetrics and Gynecology

## 2022-01-03 DIAGNOSIS — Z1231 Encounter for screening mammogram for malignant neoplasm of breast: Secondary | ICD-10-CM | POA: Diagnosis not present

## 2022-01-08 ENCOUNTER — Other Ambulatory Visit: Payer: Self-pay | Admitting: Obstetrics and Gynecology

## 2022-01-08 DIAGNOSIS — R928 Other abnormal and inconclusive findings on diagnostic imaging of breast: Secondary | ICD-10-CM

## 2022-01-10 ENCOUNTER — Ambulatory Visit
Admission: RE | Admit: 2022-01-10 | Discharge: 2022-01-10 | Disposition: A | Discharge status: Home / Self Care | Payer: BC Managed Care – PPO | Source: Ambulatory Visit | Attending: Obstetrics and Gynecology | Admitting: Obstetrics and Gynecology

## 2022-01-10 DIAGNOSIS — R928 Other abnormal and inconclusive findings on diagnostic imaging of breast: Secondary | ICD-10-CM

## 2022-01-10 DIAGNOSIS — N6011 Diffuse cystic mastopathy of right breast: Secondary | ICD-10-CM | POA: Diagnosis not present

## 2022-01-10 DIAGNOSIS — R922 Inconclusive mammogram: Secondary | ICD-10-CM | POA: Diagnosis not present

## 2022-06-04 ENCOUNTER — Encounter: Payer: Self-pay | Admitting: Plastic Surgery

## 2022-06-04 ENCOUNTER — Ambulatory Visit (INDEPENDENT_AMBULATORY_CARE_PROVIDER_SITE_OTHER): Payer: Self-pay | Admitting: Plastic Surgery

## 2022-06-04 DIAGNOSIS — Z719 Counseling, unspecified: Secondary | ICD-10-CM

## 2022-06-04 NOTE — Progress Notes (Signed)

## 2022-11-29 ENCOUNTER — Encounter: Payer: Self-pay | Admitting: Plastic Surgery

## 2022-11-29 ENCOUNTER — Ambulatory Visit (INDEPENDENT_AMBULATORY_CARE_PROVIDER_SITE_OTHER): Payer: Self-pay | Admitting: Plastic Surgery

## 2022-11-29 VITALS — BP 123/84 | HR 75

## 2022-11-29 DIAGNOSIS — Z719 Counseling, unspecified: Secondary | ICD-10-CM

## 2022-11-29 NOTE — Progress Notes (Signed)

## 2023-04-21 ENCOUNTER — Other Ambulatory Visit: Payer: Self-pay | Admitting: Obstetrics and Gynecology

## 2023-04-21 DIAGNOSIS — Z1231 Encounter for screening mammogram for malignant neoplasm of breast: Secondary | ICD-10-CM

## 2023-05-07 ENCOUNTER — Ambulatory Visit: Payer: BC Managed Care – PPO

## 2023-05-23 ENCOUNTER — Ambulatory Visit
Admission: RE | Admit: 2023-05-23 | Discharge: 2023-05-23 | Disposition: A | Discharge status: Home / Self Care | Payer: BC Managed Care – PPO | Source: Ambulatory Visit | Attending: Obstetrics and Gynecology | Admitting: Obstetrics and Gynecology

## 2023-05-23 DIAGNOSIS — Z1231 Encounter for screening mammogram for malignant neoplasm of breast: Secondary | ICD-10-CM

## 2023-06-16 ENCOUNTER — Ambulatory Visit (INDEPENDENT_AMBULATORY_CARE_PROVIDER_SITE_OTHER): Payer: Self-pay | Admitting: Plastic Surgery

## 2023-06-16 ENCOUNTER — Encounter: Payer: Self-pay | Admitting: Plastic Surgery

## 2023-06-16 DIAGNOSIS — Z719 Counseling, unspecified: Secondary | ICD-10-CM

## 2023-06-16 NOTE — Progress Notes (Signed)

## 2023-09-05 IMAGING — MG MM DIGITAL DIAGNOSTIC UNILAT*R* W/ TOMO W/ CAD
4 series · 4 of 12 positions shown · non-contrast
Comparison: Previous exam(s).

CLINICAL DATA: The patient was called back for a possible right
breast asymmetry.

EXAM:
DIGITAL DIAGNOSTIC UNILATERAL RIGHT MAMMOGRAM WITH TOMOSYNTHESIS AND
CAD; ULTRASOUND RIGHT BREAST LIMITED
TECHNIQUE: Right digital diagnostic mammography and breast tomosynthesis was
performed. The images were evaluated with computer-aided detection.;
Targeted ultrasound examination of the right breast was performed

[R MLO synth-2D]
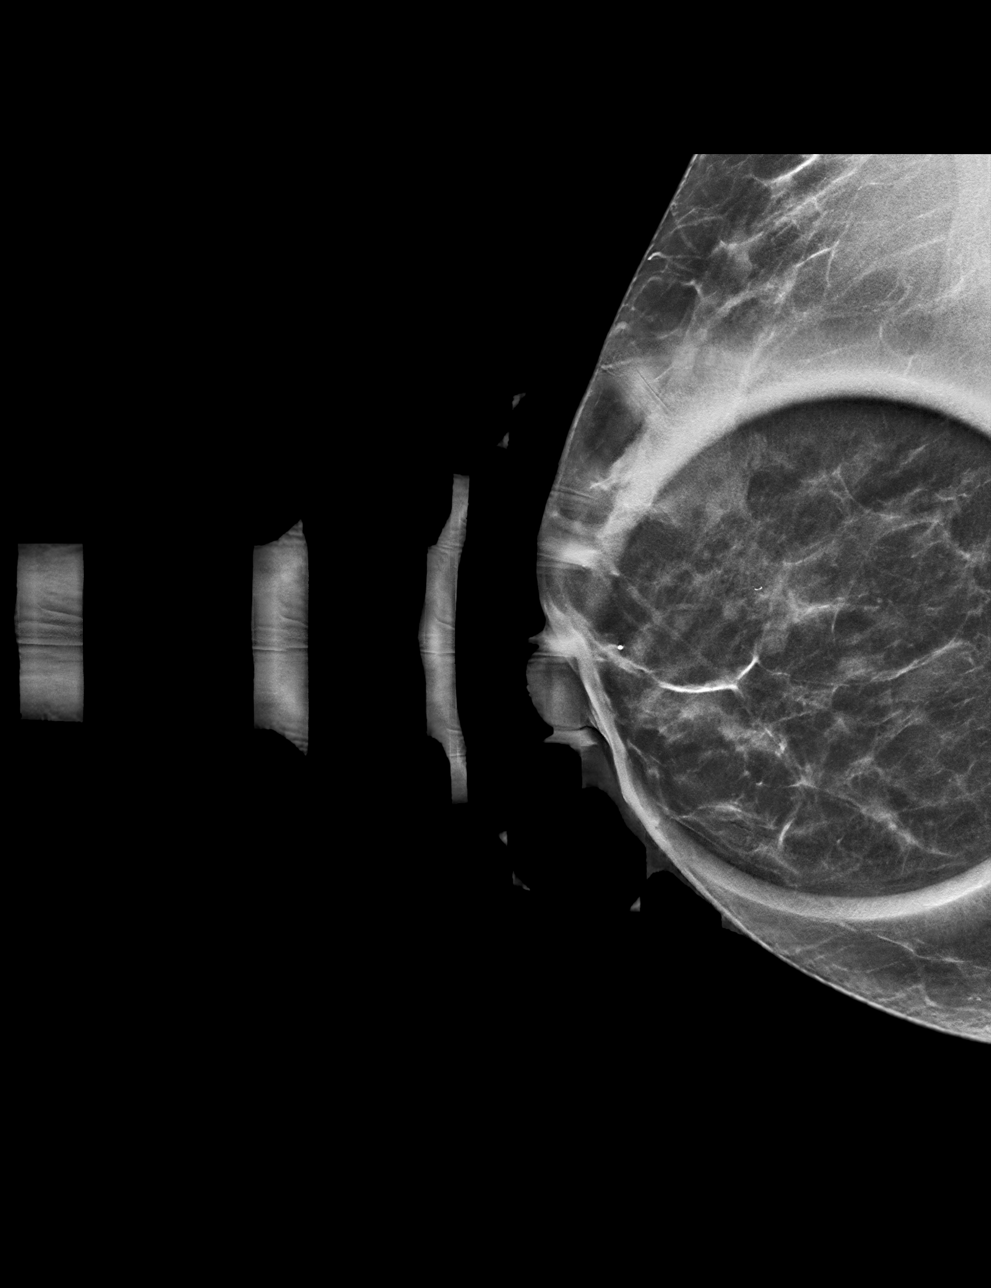

[R ML synth-2D]
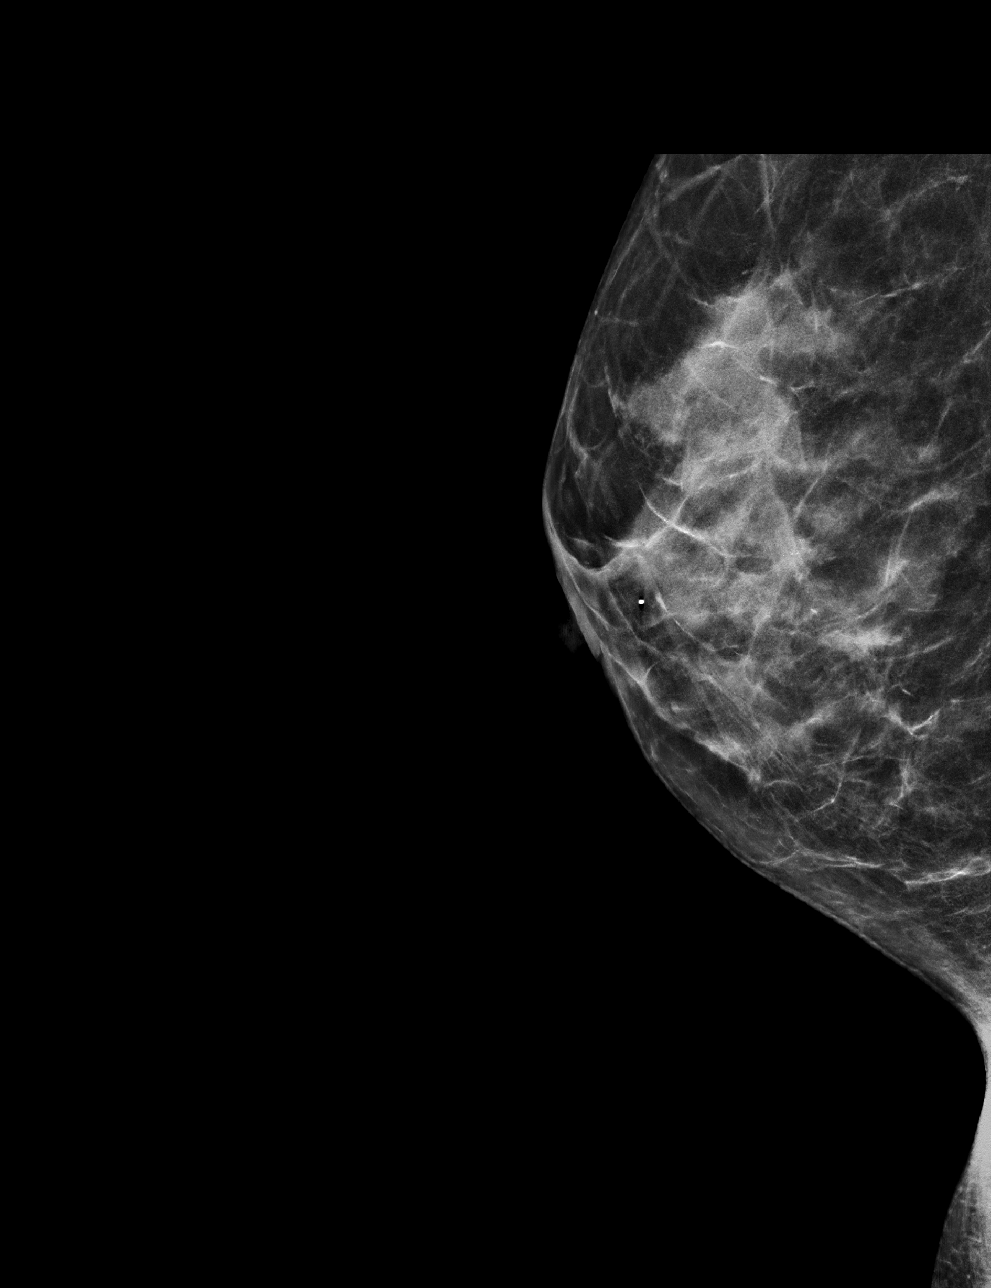

[R ML tomo · tomo slice 29/58.0]
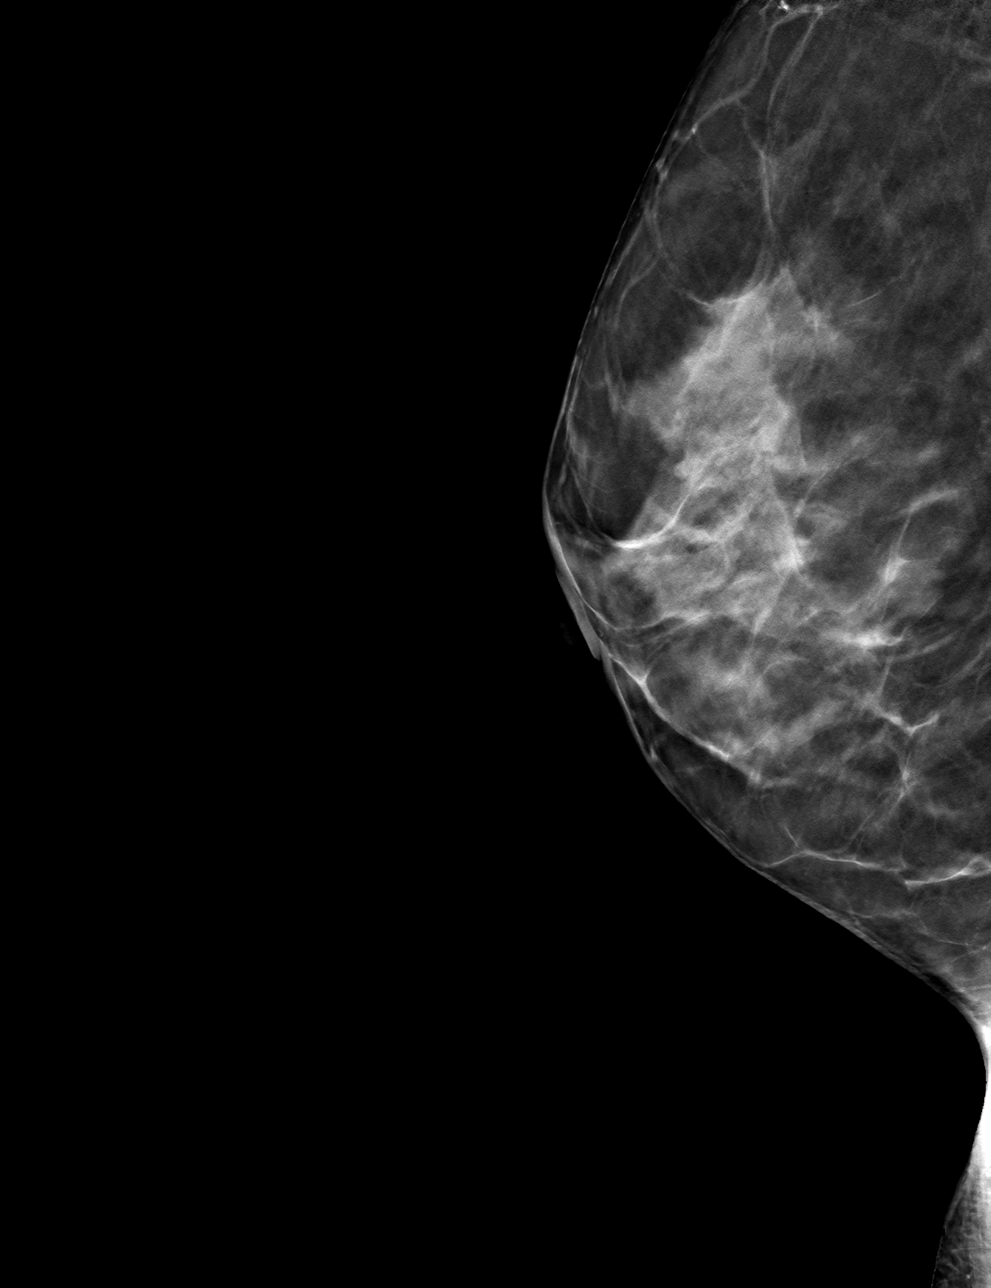

[R MLO tomo · tomo slice 27/53.0]
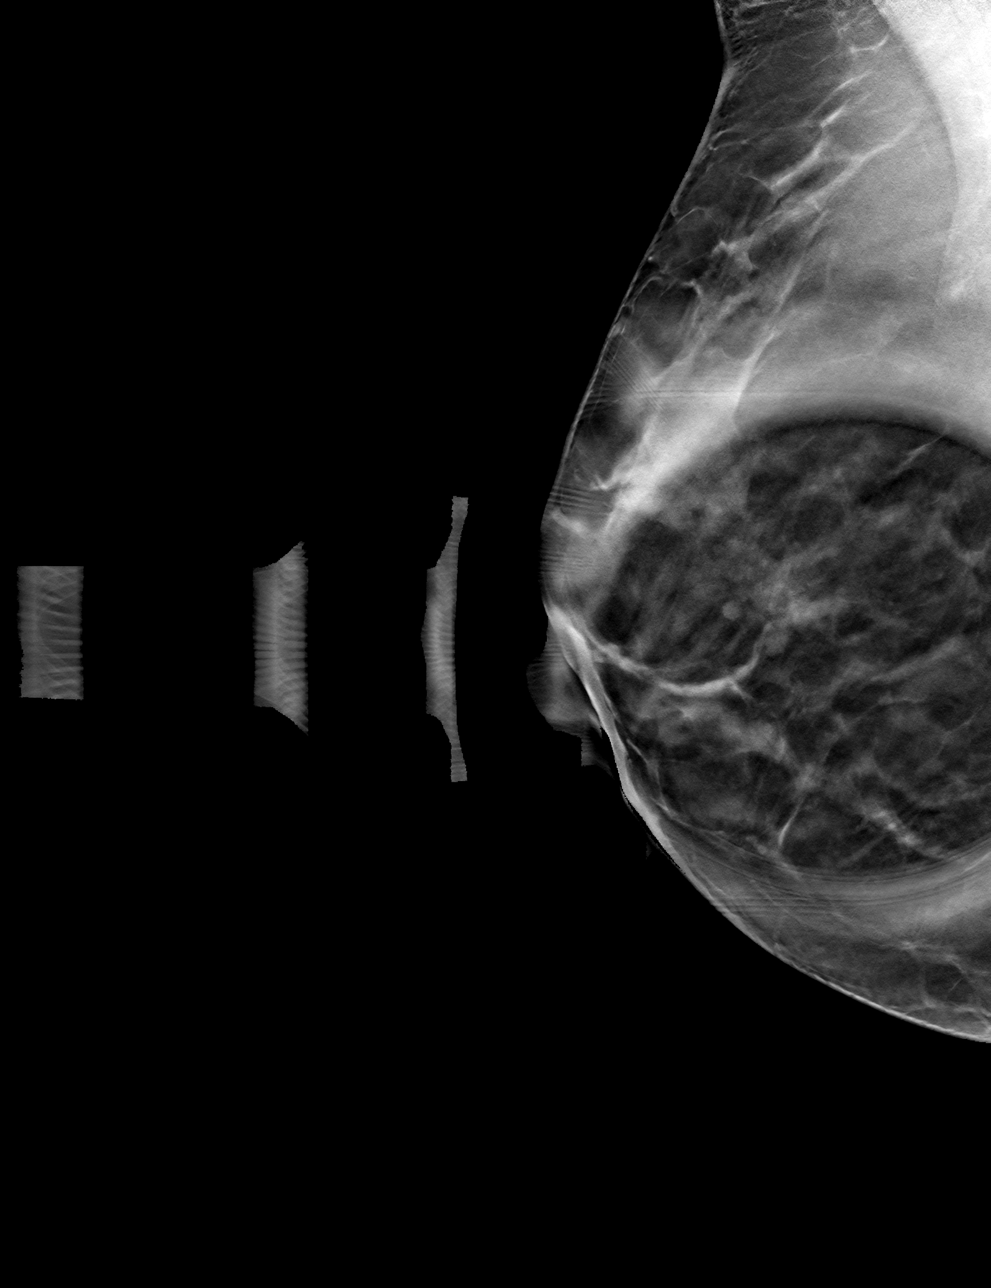

[4 of 12 positions shown; findings below may reference images not displayed]

ACR Breast Density Category c: The breast tissue is heterogeneously
dense, which may obscure small masses.
FINDINGS: The asymmetry spreads out on additional imaging, largely due to
glandular tissue. However, there are multiple obscured masses.

Targeted ultrasound is performed, showing fibrocystic changes in the
right breast accounting for the mammographically identified masses.
IMPRESSION: Fibrocystic changes.  No evidence of malignancy.

RECOMMENDATION:
Annual screening mammography.

I have discussed the findings and recommendations with the patient.
If applicable, a reminder letter will be sent to the patient
regarding the next appointment.

BI-RADS CATEGORY  2: Benign.

## 2023-09-05 IMAGING — US US BREAST*R* LIMITED INC AXILLA
1 series · 11 of 11 positions shown · non-contrast
Comparison: Previous exam(s).

CLINICAL DATA: The patient was called back for a possible right
breast asymmetry.

EXAM:
DIGITAL DIAGNOSTIC UNILATERAL RIGHT MAMMOGRAM WITH TOMOSYNTHESIS AND
CAD; ULTRASOUND RIGHT BREAST LIMITED
TECHNIQUE: Right digital diagnostic mammography and breast tomosynthesis was
performed. The images were evaluated with computer-aided detection.;
Targeted ultrasound examination of the right breast was performed

[Series 1: us breast*right* limited inc axilla · 0.06mm/px · 11 of 11 slices shown]
[im 1/11]
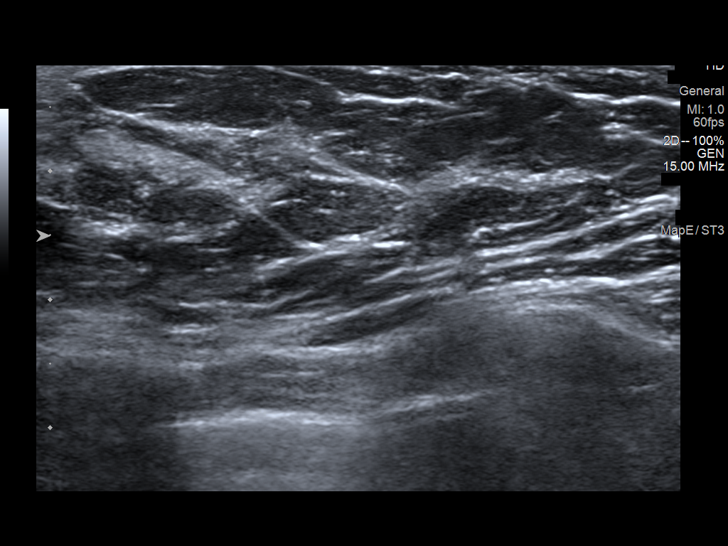
[im 2/11]
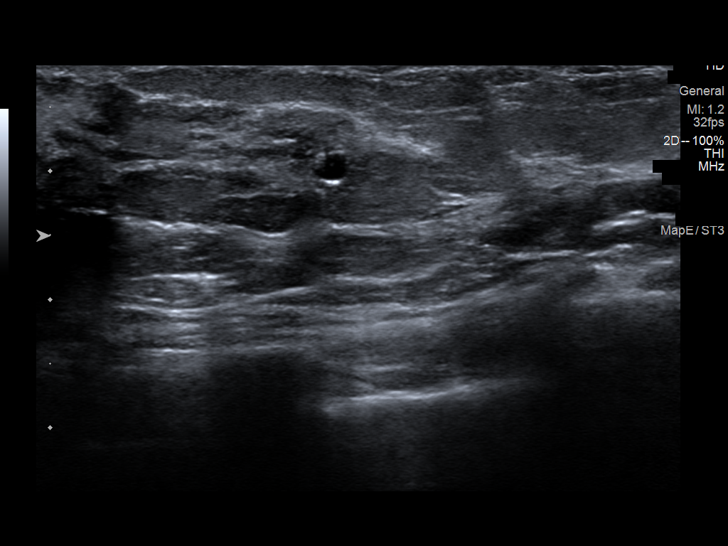
[im 3/11]
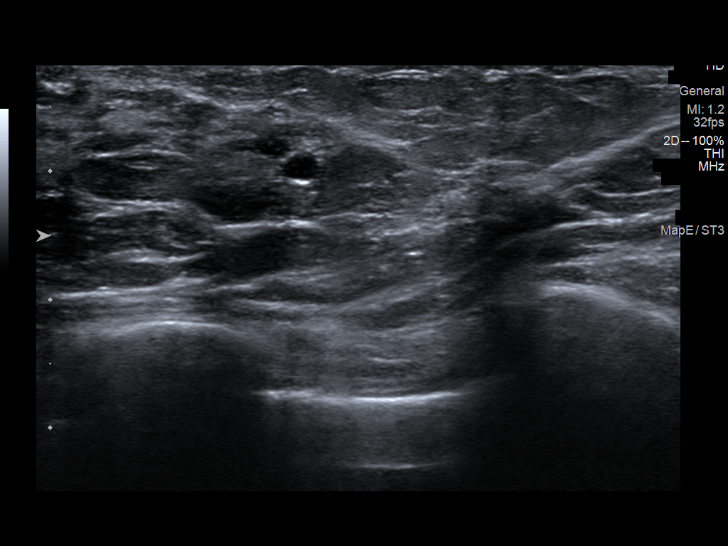
[im 4/11]
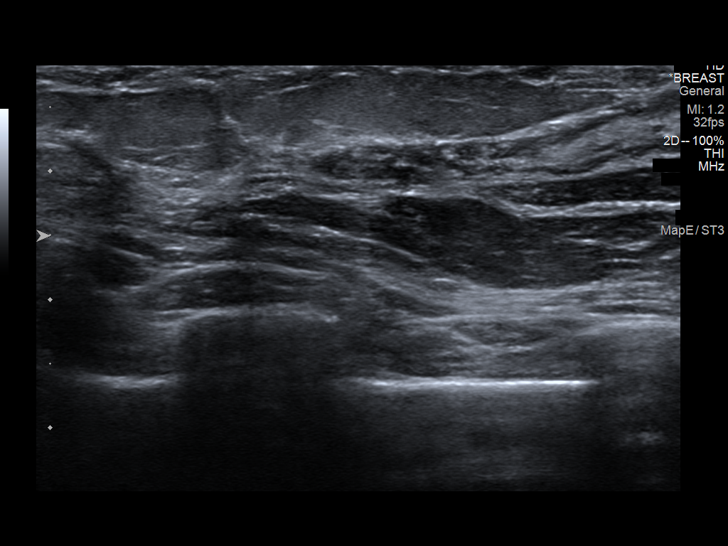
[im 5/11]
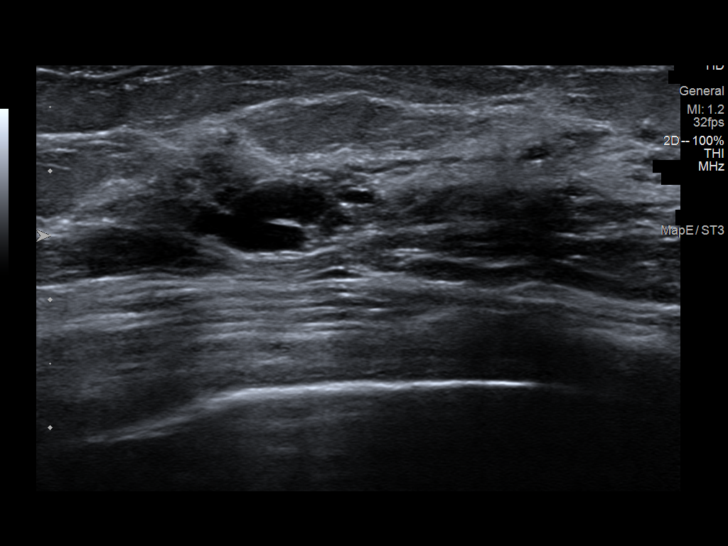
[im 6/11]
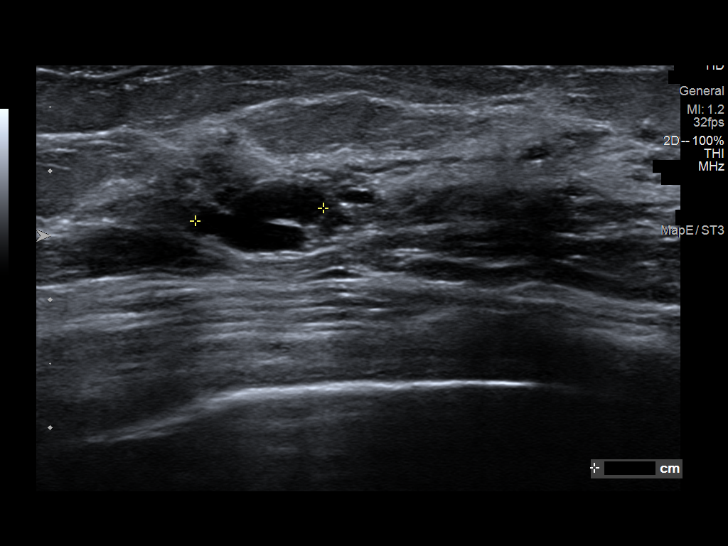
[im 7/11]
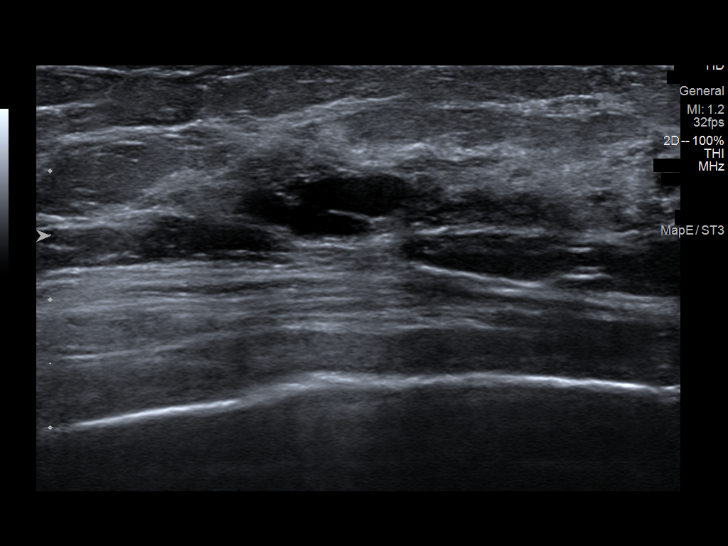
[im 8/11]
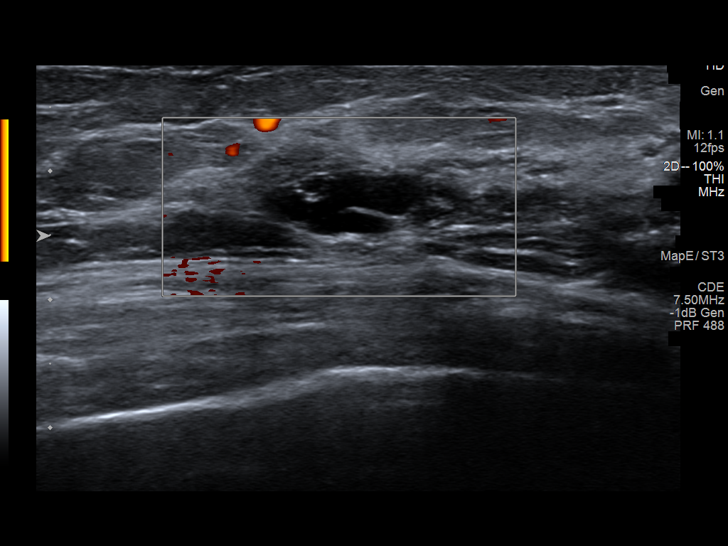
[im 9/11]
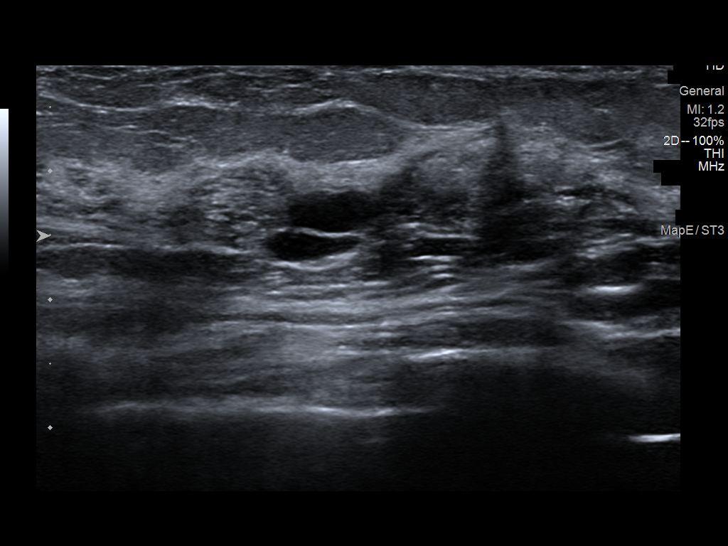
[im 10/11]
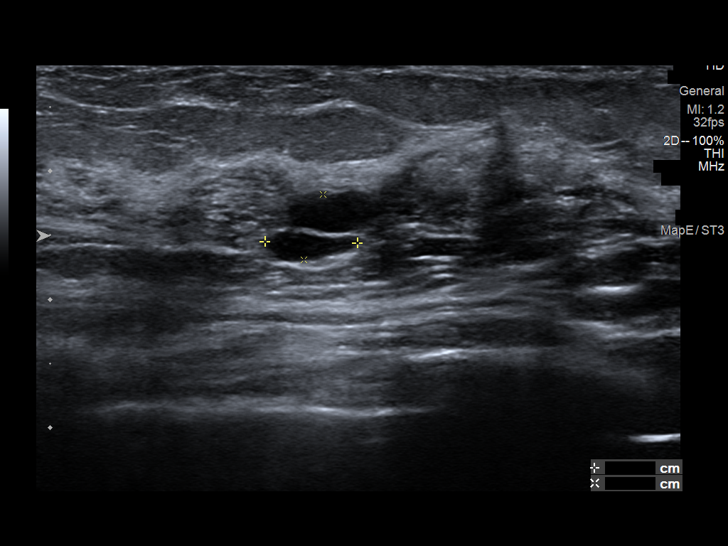
[im 11/11]
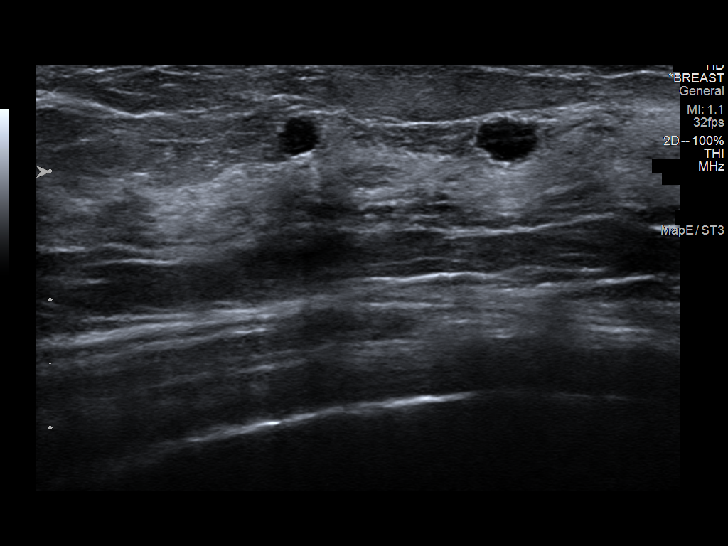

[11 of 11 positions shown; findings below may reference images not displayed]

ACR Breast Density Category c: The breast tissue is heterogeneously
dense, which may obscure small masses.
FINDINGS: The asymmetry spreads out on additional imaging, largely due to
glandular tissue. However, there are multiple obscured masses.

Targeted ultrasound is performed, showing fibrocystic changes in the
right breast accounting for the mammographically identified masses.
IMPRESSION: Fibrocystic changes.  No evidence of malignancy.

RECOMMENDATION:
Annual screening mammography.

I have discussed the findings and recommendations with the patient.
If applicable, a reminder letter will be sent to the patient
regarding the next appointment.

BI-RADS CATEGORY  2: Benign.

## 2023-12-09 DIAGNOSIS — Z124 Encounter for screening for malignant neoplasm of cervix: Secondary | ICD-10-CM | POA: Diagnosis not present

## 2023-12-09 DIAGNOSIS — Z01419 Encounter for gynecological examination (general) (routine) without abnormal findings: Secondary | ICD-10-CM | POA: Diagnosis not present

## 2023-12-25 DIAGNOSIS — Z1212 Encounter for screening for malignant neoplasm of rectum: Secondary | ICD-10-CM | POA: Diagnosis not present

## 2023-12-25 DIAGNOSIS — Z1211 Encounter for screening for malignant neoplasm of colon: Secondary | ICD-10-CM | POA: Diagnosis not present

## 2023-12-30 ENCOUNTER — Ambulatory Visit (INDEPENDENT_AMBULATORY_CARE_PROVIDER_SITE_OTHER): Payer: Self-pay | Admitting: Plastic Surgery

## 2023-12-30 DIAGNOSIS — Z719 Counseling, unspecified: Secondary | ICD-10-CM

## 2023-12-30 NOTE — Progress Notes (Signed)

## 2024-01-02 LAB — COLOGUARD: COLOGUARD: NEGATIVE

## 2024-03-30 DIAGNOSIS — E785 Hyperlipidemia, unspecified: Secondary | ICD-10-CM | POA: Diagnosis not present

## 2024-04-06 DIAGNOSIS — R82998 Other abnormal findings in urine: Secondary | ICD-10-CM | POA: Diagnosis not present

## 2024-04-06 DIAGNOSIS — Z0000 Encounter for general adult medical examination without abnormal findings: Secondary | ICD-10-CM | POA: Diagnosis not present

## 2024-04-06 DIAGNOSIS — Z1339 Encounter for screening examination for other mental health and behavioral disorders: Secondary | ICD-10-CM | POA: Diagnosis not present

## 2024-04-06 DIAGNOSIS — Z1331 Encounter for screening for depression: Secondary | ICD-10-CM | POA: Diagnosis not present
